# Patient Record
Sex: Female | Born: 1980 | Race: White | Hispanic: No | Marital: Married | Smoking: Never smoker
Health system: Southern US, Community
[De-identification: ages and names within clinical notes are randomized; demographics above are authoritative.]

## PROBLEM LIST (undated history)

## (undated) DIAGNOSIS — F419 Anxiety disorder, unspecified: Secondary | ICD-10-CM

## (undated) HISTORY — PX: DILATION AND CURETTAGE, DIAGNOSTIC / THERAPEUTIC: SUR384

---

## 2005-01-04 ENCOUNTER — Other Ambulatory Visit: Admission: RE | Admit: 2005-01-04 | Discharge: 2005-01-04 | Payer: Self-pay | Admitting: Obstetrics and Gynecology

## 2005-09-20 ENCOUNTER — Other Ambulatory Visit: Admission: RE | Admit: 2005-09-20 | Discharge: 2005-09-20 | Payer: Self-pay | Admitting: Obstetrics and Gynecology

## 2005-09-21 ENCOUNTER — Other Ambulatory Visit: Admission: RE | Admit: 2005-09-21 | Discharge: 2005-09-21 | Payer: Self-pay | Admitting: Obstetrics and Gynecology

## 2005-11-12 ENCOUNTER — Encounter (INDEPENDENT_AMBULATORY_CARE_PROVIDER_SITE_OTHER): Payer: Self-pay | Admitting: *Deleted

## 2005-11-12 ENCOUNTER — Ambulatory Visit (HOSPITAL_COMMUNITY): Admission: AD | Admit: 2005-11-12 | Discharge: 2005-11-12 | Payer: Self-pay | Admitting: Obstetrics & Gynecology

## 2006-08-27 ENCOUNTER — Inpatient Hospital Stay (HOSPITAL_COMMUNITY): Admission: AD | Admit: 2006-08-27 | Discharge: 2006-08-27 | Payer: Self-pay | Admitting: Obstetrics and Gynecology

## 2006-09-23 ENCOUNTER — Inpatient Hospital Stay (HOSPITAL_COMMUNITY): Admission: AD | Admit: 2006-09-23 | Discharge: 2006-09-25 | Payer: Self-pay | Admitting: Obstetrics and Gynecology

## 2009-10-20 ENCOUNTER — Inpatient Hospital Stay (HOSPITAL_COMMUNITY): Admission: AD | Admit: 2009-10-20 | Discharge: 2009-10-22 | Payer: Self-pay | Admitting: Obstetrics and Gynecology

## 2010-12-27 ENCOUNTER — Other Ambulatory Visit: Payer: Self-pay | Admitting: Obstetrics and Gynecology

## 2011-02-13 LAB — CBC
HCT: 39.7 % (ref 36.0–46.0)
Hemoglobin: 11.3 g/dL — ABNORMAL LOW (ref 12.0–15.0)
Hemoglobin: 13.1 g/dL (ref 12.0–15.0)
MCHC: 33.8 g/dL (ref 30.0–36.0)
MCV: 91 fL (ref 78.0–100.0)
RBC: 3.68 MIL/uL — ABNORMAL LOW (ref 3.87–5.11)
RBC: 4.35 MIL/uL (ref 3.87–5.11)
WBC: 12.1 10*3/uL — ABNORMAL HIGH (ref 4.0–10.5)
WBC: 14.7 10*3/uL — ABNORMAL HIGH (ref 4.0–10.5)

## 2011-03-30 NOTE — Op Note (Signed)
NAME:  Destiny Willis, Destiny Willis                ACCOUNT NO.:  [id number]   MEDICAL RECORD NO.:  [id number]          PATIENT TYPE:  MAT   LOCATION:  [facility]                          FACILITY:  [facility]   PHYSICIAN:  Gerrit Friends. Aldona Bar, M.D.   DATE OF BIRTH:  Oct 19, 1981   DATE OF PROCEDURE:  11/12/2005  DATE OF DISCHARGE:                                 OPERATIVE REPORT   PREOPERATIVE DIAGNOSIS:  First trimester incomplete abortion, blood type A+.   POSTOPERATIVE DIAGNOSIS:  First trimester incomplete abortion, blood type  A+. Pathology pending.   PROCEDURE:  Suction dilatation and curettage.   SURGEON:  Gerrit Friends. Aldona Bar, M.D.   ANESTHESIA:  Intravenous conscious sedation and paracervical block with 1%  Xylocaine with epinephrine.   HISTORY:  This 30 year old primigravid patient was seen by me in the office  on December 29 - at that time she was having some vaginal bleeding.  Previously she had been in the office as a new OB patient and had an  ultrasound when she was approximately [redacted] weeks gestation which confirmed a 6-  week intrauterine pregnancy with a fetal pole and fetal heartbeat. Fetal  heart at that time was reported as 100.  She called the office on the  afternoon of 12/29 with the onset of vaginal spotting and was asked to come  the office at which time I saw her and examined her. There was some old  blood in the vaginal vault and an ultrasound that I did revealed a 9-week  gestational sac with a 6-week fetal pole with no heartbeat a fetal movement.  Diagnosis of a missed AB was made and the patient was counseled. I suggested  D&C for evacuation of the pregnancy. The patient preferred to wait and have  a follow-up ultrasound and a visit in the office 5 days hence. She was  instructed as to bleeding and cramping , etc.  At midnight or shortly  thereafter on November 12, 2005, her husband called - the patient was having  significant vaginal bleeding and she was told to present to Maternity  Admissions which she did shortly thereafter. Her bleeding was moderate and a  decision was made to proceed with a suction dilatation and curettage for  evacuation of now an incomplete abortion.   DESCRIPTION OF PROCEDURE:  The patient was taken to the operating room after  the satisfactory induction of intravenous conscious sedation. She was  prepped and draped and placed in the modified lithotomy position in short  Allen stirrups. The bladder was draining clear urine through her catheter in  an in and out fashion and a speculum was placed in the vagina. The vaginal  vault was filled clot and once this was removed, the cervix was noted to be  at least a 1/2 cm dilated. A single tooth tenaculum was placed on the  anterior lip of cervix and a paracervical block was carried out with  approximately 11 mL of 1% Xylocaine with epinephrine. There was no need to  dilate the internal os as it was already dilated beyond the 25 Pratt  dilator. A #  8 suction curette was placed into the cavity and gently  thereafter the cavity was evacuated of products of conception. Curettage  with a medium standard curette then found the cavity to be clean.  Resuctioning produced no additional products of conception. The procedure at  this time was felt to be complete and was terminated. Examination at this  time found the uterus to be firm, midplane to posterior and bleeding was  minimal.   The patient was transported to the recovery room and thereafter she will be  discharged to home.   DISCHARGE MEDICATIONS:  Doxycycline 100 mg twice daily for a total of 5 days  and Anaprox DS 1 every 6-8 hours as needed for cramping.   FOLLOW UP:  She will be asked to return to the office in approximately 2-3  weeks time for follow-up or as needed.   CONDITION ON ARRIVAL TO RECOVERY ROOM:  Satisfactory.      Gerrit Friends. Aldona Bar, M.D.  Electronically Signed     RMW/MEDQ  D:  11/12/2005  T:  11/12/2005  Job:  [id number]

## 2014-01-18 ENCOUNTER — Other Ambulatory Visit: Payer: Self-pay | Admitting: Obstetrics and Gynecology

## 2016-07-25 ENCOUNTER — Other Ambulatory Visit: Payer: Self-pay | Admitting: Obstetrics & Gynecology

## 2016-07-26 LAB — CYTOLOGY - PAP

## 2016-09-13 DIAGNOSIS — F419 Anxiety disorder, unspecified: Secondary | ICD-10-CM | POA: Insufficient documentation

## 2017-05-09 ENCOUNTER — Emergency Department: Payer: BLUE CROSS/BLUE SHIELD

## 2017-05-09 ENCOUNTER — Other Ambulatory Visit: Payer: Self-pay

## 2017-05-09 ENCOUNTER — Encounter: Payer: Self-pay | Admitting: Emergency Medicine

## 2017-05-09 ENCOUNTER — Emergency Department
Admission: EM | Admit: 2017-05-09 | Discharge: 2017-05-09 | Disposition: A | Discharge status: Home / Self Care | Payer: BLUE CROSS/BLUE SHIELD | Attending: Emergency Medicine | Admitting: Emergency Medicine

## 2017-05-09 DIAGNOSIS — K8070 Calculus of gallbladder and bile duct without cholecystitis without obstruction: Secondary | ICD-10-CM

## 2017-05-09 DIAGNOSIS — R1011 Right upper quadrant pain: Secondary | ICD-10-CM | POA: Insufficient documentation

## 2017-05-09 DIAGNOSIS — R7989 Other specified abnormal findings of blood chemistry: Secondary | ICD-10-CM

## 2017-05-09 DIAGNOSIS — R945 Abnormal results of liver function studies: Secondary | ICD-10-CM

## 2017-05-09 DIAGNOSIS — R109 Unspecified abdominal pain: Secondary | ICD-10-CM | POA: Diagnosis present

## 2017-05-09 HISTORY — DX: Anxiety disorder, unspecified: F41.9

## 2017-05-09 LAB — LIPASE, BLOOD: LIPASE: 34 U/L (ref 11–51)

## 2017-05-09 LAB — COMPREHENSIVE METABOLIC PANEL
ALT: 637 U/L — AB (ref 14–54)
AST: 232 U/L — AB (ref 15–41)
Albumin: 4 g/dL (ref 3.5–5.0)
Alkaline Phosphatase: 140 U/L — ABNORMAL HIGH (ref 38–126)
Anion gap: 7 (ref 5–15)
BUN: 9 mg/dL (ref 6–20)
CHLORIDE: 102 mmol/L (ref 101–111)
CO2: 28 mmol/L (ref 22–32)
Calcium: 9.1 mg/dL (ref 8.9–10.3)
Creatinine, Ser: 0.7 mg/dL (ref 0.44–1.00)
Glucose, Bld: 84 mg/dL (ref 65–99)
POTASSIUM: 4.1 mmol/L (ref 3.5–5.1)
SODIUM: 137 mmol/L (ref 135–145)
Total Bilirubin: 1.3 mg/dL — ABNORMAL HIGH (ref 0.3–1.2)
Total Protein: 7.3 g/dL (ref 6.5–8.1)

## 2017-05-09 LAB — CBC
HEMATOCRIT: 42.3 % (ref 35.0–47.0)
Hemoglobin: 14.4 g/dL (ref 12.0–16.0)
MCH: 29.7 pg (ref 26.0–34.0)
MCHC: 34.1 g/dL (ref 32.0–36.0)
MCV: 87.1 fL (ref 80.0–100.0)
Platelets: 239 10*3/uL (ref 150–440)
RBC: 4.86 MIL/uL (ref 3.80–5.20)
RDW: 13.4 % (ref 11.5–14.5)
WBC: 7.8 10*3/uL (ref 3.6–11.0)

## 2017-05-09 LAB — ACETAMINOPHEN LEVEL: Acetaminophen (Tylenol), Serum: <10 ug/mL — ABNORMAL LOW (ref 10–30)

## 2017-05-09 MED ORDER — HYDROCODONE-ACETAMINOPHEN 5-325 MG PO TABS: 1.0000 | ORAL_TABLET | Freq: Four times a day (QID) | ORAL | 0 refills | Status: AC | PRN

## 2017-05-09 MED ORDER — ONDANSETRON 4 MG PO TBDP: ORAL_TABLET | ORAL | 0 refills | Status: AC

## 2017-05-09 MED ORDER — ONDANSETRON HCL 4 MG/2ML IJ SOLN
4.0000 mg | Freq: Once | INTRAMUSCULAR | Status: AC
  Administered 2017-05-09: 4 mg via INTRAVENOUS
  Filled 2017-05-09: qty 2

## 2017-05-09 MED ORDER — SODIUM CHLORIDE 0.9 % IV BOLUS (SEPSIS)
1000.0000 mL | Freq: Once | INTRAVENOUS | Status: AC
  Administered 2017-05-09: 1000 mL via INTRAVENOUS

## 2017-05-09 NOTE — ED Notes (Signed)
Pt given ginger ale to PO challenge 

## 2017-05-09 NOTE — Discharge Instructions (Signed)
Stay hydrated.   Take zofran as needed for nausea.   Expect some persistent epigastric and right sided abdominal pain.   Avoid fatty food or fried foods.   Take motrin for mild pain. Take vicodin for severe pain. DO NOT drive with it.   Surgery wants to see you next Friday at 9 am. Don't eat anything that morning and come at 8:30 am to get labs   Return to ER if you have fever, severe abdominal pain, vomiting, dehydration.

## 2017-05-09 NOTE — ED Notes (Signed)
Patient transported to Ultrasound 

## 2017-05-09 NOTE — ED Provider Notes (Signed)
[facility]-EMERGENCY DEPARTMENT Provider Note   CSN: [id number] Arrival date & time: 05/09/17  1033     History   Chief Complaint Chief Complaint  Patient presents with  . Abdominal Pain    HPI Destiny Sw[place] is a 36 y.o. female history of anxiety here presenting with abdominal pain. Patient has epigastric pain that radiates to bilateral flanks for the last 3 days. Patient states that it is worse when she eats and she feels nauseated but has not been throwing up. Denies any diarrhea or fevers. Denies any urinary symptoms. Patient went to primary care doctor yesterday and was noted to have elevated LFTs as well as a lipase over 1000. Patient denies any recent travel anywhere or eating uncooked meat. Patient is otherwise healthy.  The history is provided by the patient.    Past Medical History:  Diagnosis Date  . Anxiety     There are no active problems to display for this patient.   Past Surgical History:  Procedure Laterality Date  . DILATION AND CURETTAGE, DIAGNOSTIC / THERAPEUTIC      OB History    No data available       Home Medications    Prior to Admission medications   Not on File    Family History History reviewed. No pertinent family history.  Social History Social History  Substance Use Topics  . Smoking status: Never Smoker  . Smokeless tobacco: Never Used  . Alcohol use No     Allergies   Patient has no known allergies.   Review of Systems Review of Systems  Gastrointestinal: Positive for abdominal pain and nausea.  All other systems reviewed and are negative.    Physical Exam Updated Vital Signs BP (!) 101/59   Pulse 68   Temp 98 F (36.7 C) (Oral)   Resp 16   Ht 5\' 7"  (1.702 m)   Wt 72.1 kg (159 lb)   SpO2 98%   BMI 24.90 kg/m   Physical Exam  Constitutional: She is oriented to person, place, and time.  Slightly uncomfortable, MM slightly dry   HENT:  Head: Normocephalic.  Mouth/Throat: Oropharynx is clear and moist.    Eyes: Conjunctivae and EOM are normal. Pupils are equal, round, and reactive to light.  Neck: Normal range of motion. Neck supple.  Cardiovascular: Normal rate, regular rhythm and normal heart sounds.   Pulmonary/Chest: Effort normal and breath sounds normal. No respiratory distress. She has no wheezes. She has no rales.  Abdominal: Soft. Bowel sounds are normal.  Mild epigastric and RUQ tenderness, neg murphy. No obvious R CVAT   Musculoskeletal: Normal range of motion.  Neurological: She is alert and oriented to person, place, and time. No cranial nerve deficit. Coordination normal.  Skin: Skin is warm.  Psychiatric: She has a normal mood and affect.  Nursing note and vitals reviewed.    ED Treatments / Results  Labs (all labs ordered are listed, but only abnormal results are displayed) Labs Reviewed  COMPREHENSIVE METABOLIC PANEL - Abnormal; Notable for the following:       Result Value   AST 232 (*)    ALT 637 (*)    Alkaline Phosphatase 140 (*)    Total Bilirubin 1.3 (*)    All other components within normal limits  ACETAMINOPHEN LEVEL - Abnormal; Notable for the following:    Acetaminophen (Tylenol), Serum <10 (*)    All other components within normal limits  LIPASE, BLOOD  CBC  URINALYSIS, COMPLETE (UACMP) WITH MICROSCOPIC  HEPATITIS PANEL, ACUTE  POC URINE PREG, ED    EKG  EKG Interpretation None       Radiology US Abdomen Limited Ruq  Result Date: 05/09/2017 CLINICAL DATA:  Intermittent right upper quadrant pain for the past 4 days. EXAM: ULTRASOUND ABDOMEN LIMITED RIGHT UPPER QUADRANT COMPARISON:  None in PACs FINDINGS: Gallbladder: The gallbladder is adequately distended. Echogenic stones and sludge are present. There is no gallbladder wall thickening, pericholecystic fluid, or positive sonographic Murphy's sign. Common bile duct: Diameter: 4.7-6.6 mm.  No abnormal intraluminal echoes are observed. Liver: The hepatic echotexture is mildly increased. There is  no focal mass nor ductal dilation. The surface contour is smooth where visualized. IMPRESSION: Gallstones and sludge without sonographic evidence of acute cholecystitis. Increased hepatic echotexture most compatible with fatty infiltrative change. Electronically Signed   By: David  Willis M.D.   On: 05/09/2017 13:41    Procedures Procedures (including critical care time)  Medications Ordered in ED Medications  sodium chloride 0.9 % bolus 1,000 mL (1,000 mLs Intravenous New Bag/Given 05/09/17 1233)  ondansetron (ZOFRAN) injection 4 mg (4 mg Intravenous Given 05/09/17 1233)     Initial Impression / Assessment and Plan / ED Course  I have reviewed the triage vital signs and the nursing notes.  Pertinent labs & imaging results that were available during my care of the patient were reviewed by me and considered in my medical decision making (see chart for details).    Destiny Willis is a 36 y.o. female here with abdominal pain, elevated LFTs and lipase. Lipase was 1300 yesterday. Will repeat labs and LFTs and lipase. Concerned for possible gallstone pancreatitis. Will add hepatitis panel as well. Will get RUQ [place] to further assess.   1:59 PM  LFTs improving- AST 232 and ALT 637 down from 748 and 964 yesterday. Lipase was 1948 down to 35 today. No pain in the ED, refused pain meds. Tolerated PO in the ED. US showed gallstones with sludge with no acute chole. I called Dr. Aleen Campi from surgery, who reviewed records. Agrees with close outpatient follow up. He was able to make her an appointment next Fri (in about a week), for repeat LFTs and appointment in clinic. Gave strict return precautions.     Final Clinical Impressions(s) / ED Diagnoses   Final diagnoses:  RUQ pain    New Prescriptions New Prescriptions   No medications on file     Charlynne Pander, MD 05/09/17 1404

## 2017-05-09 NOTE — ED Triage Notes (Signed)
Pt c/o upper abdominal pain for 2 days. Was seen at doctor yesterday. Sent today for lipase over 1000.  Pt denies pain at this time.  No vomiting or diarrhea. Pain was worse after eating initially.  ambulatory to triage. NAD. VSS. No fevers.

## 2017-05-10 LAB — HEPATITIS PANEL, ACUTE
HCV Ab: <0.1 s/co ratio (ref 0.0–0.9)
HEP B C IGM: NEGATIVE
Hep A IgM: NEGATIVE
Hepatitis B Surface Ag: NEGATIVE

## 2017-05-13 ENCOUNTER — Other Ambulatory Visit: Payer: Self-pay

## 2017-05-17 ENCOUNTER — Inpatient Hospital Stay: Payer: Self-pay | Admitting: Surgery

## 2019-03-16 IMAGING — US US ABDOMEN LIMITED
1 series · 14 of 25 positions shown · non-contrast
Comparison: None in PACs

CLINICAL DATA: Intermittent right upper quadrant pain for the past
4 days.

EXAM:
ULTRASOUND ABDOMEN LIMITED RIGHT UPPER QUADRANT

[Series 1: us abdomen limited · 0.20mm/px · 14 of 67 slices shown]
[im 1/67]
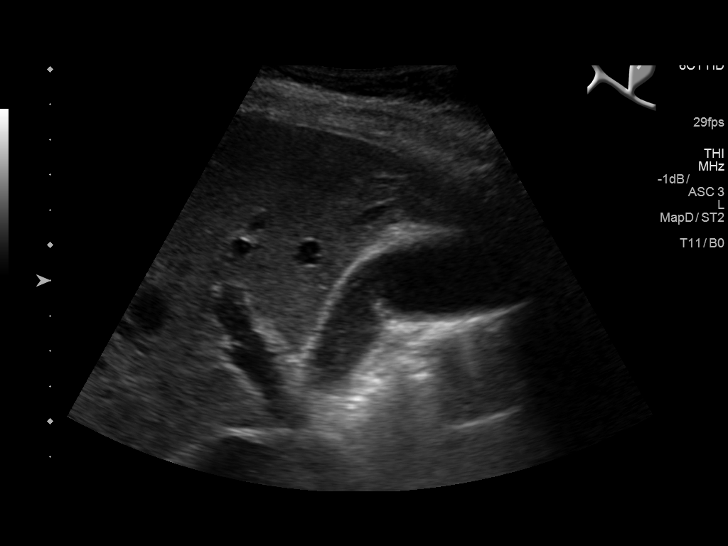
[im 6/67]
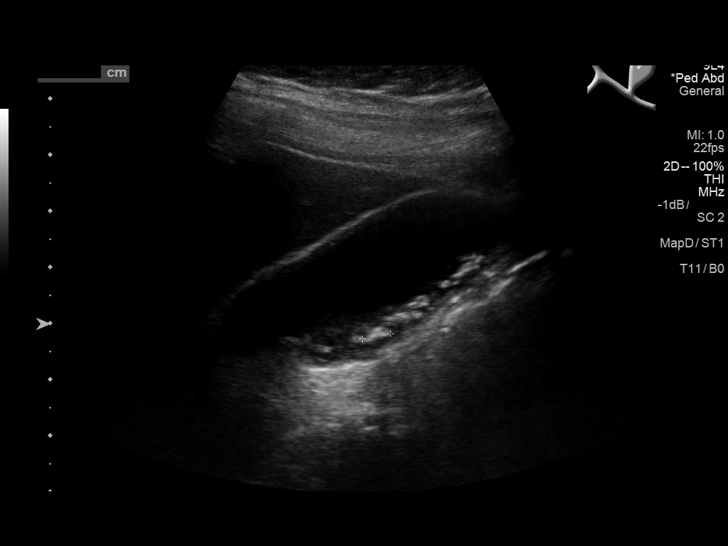
[im 12/67]
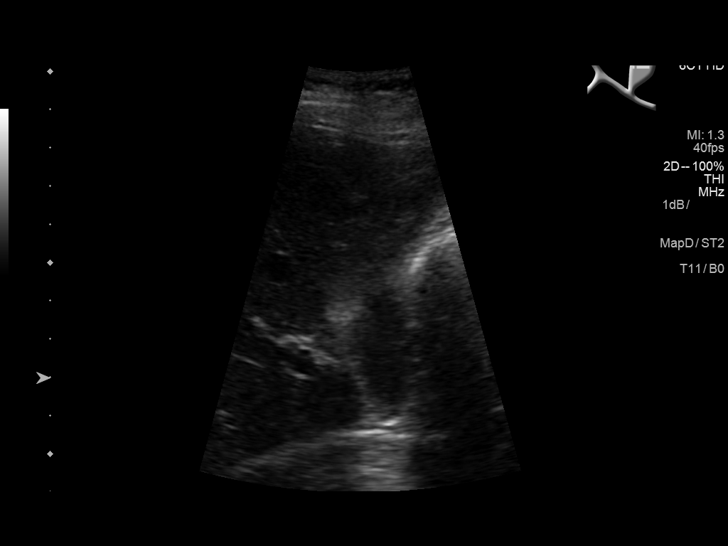
[im 17/67]
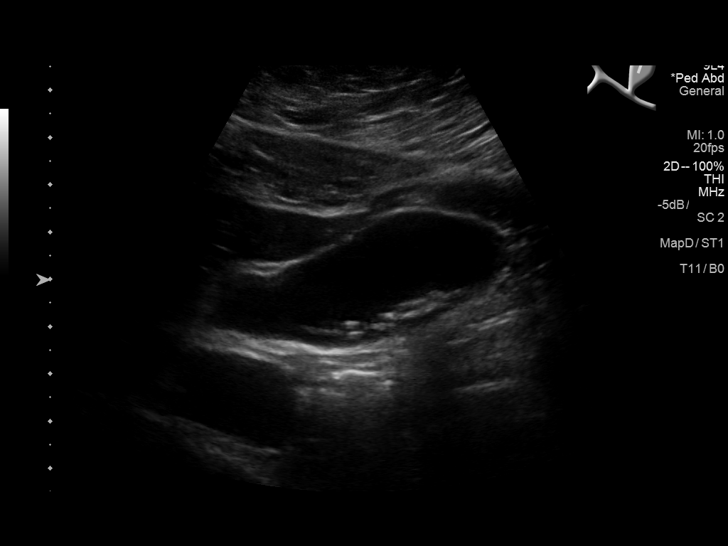
[im 23/67]
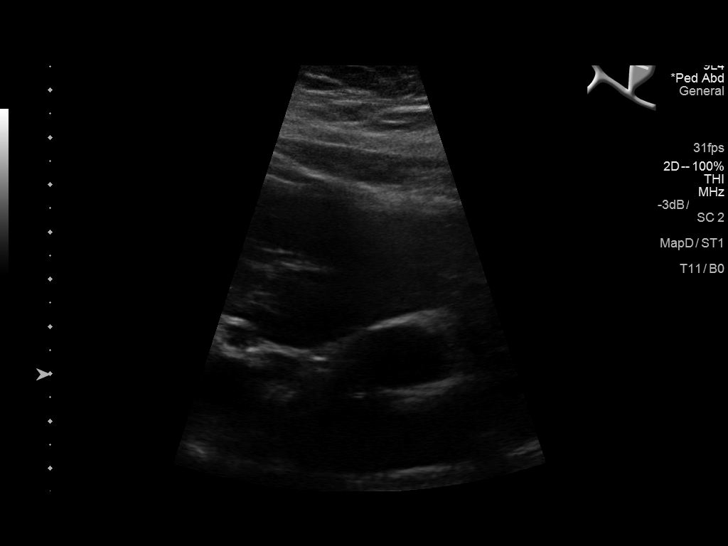
[im 25/67]
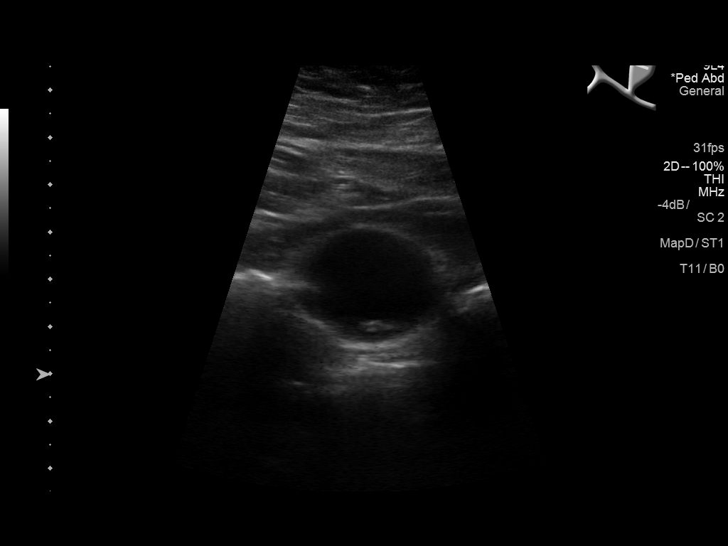
[im 31/67]
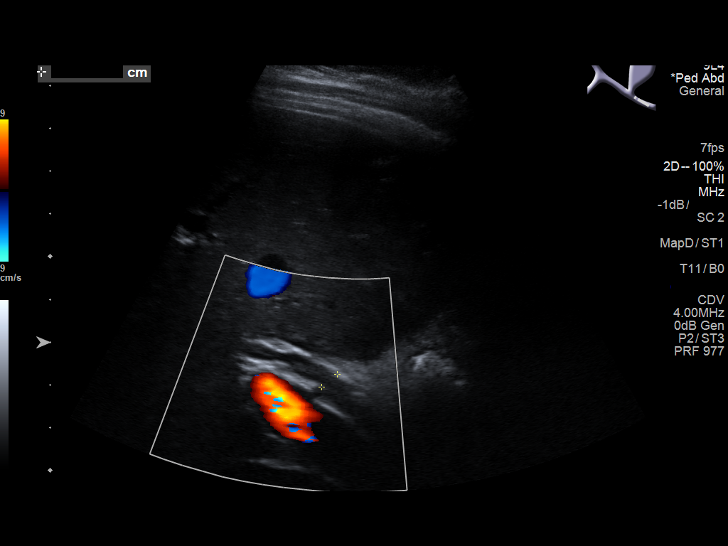
[im 36/67]
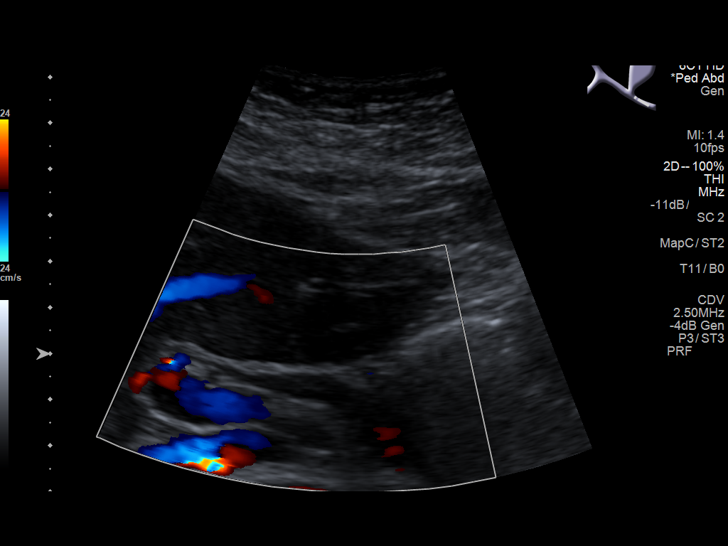
[im 42/67]
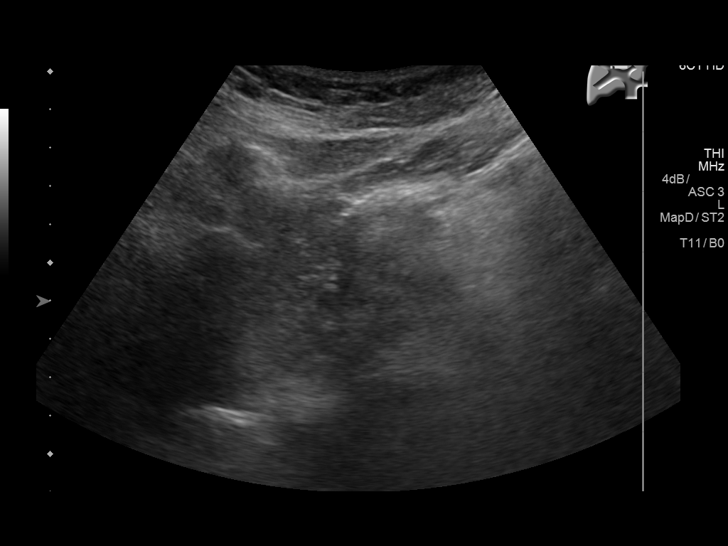
[im 45/67]
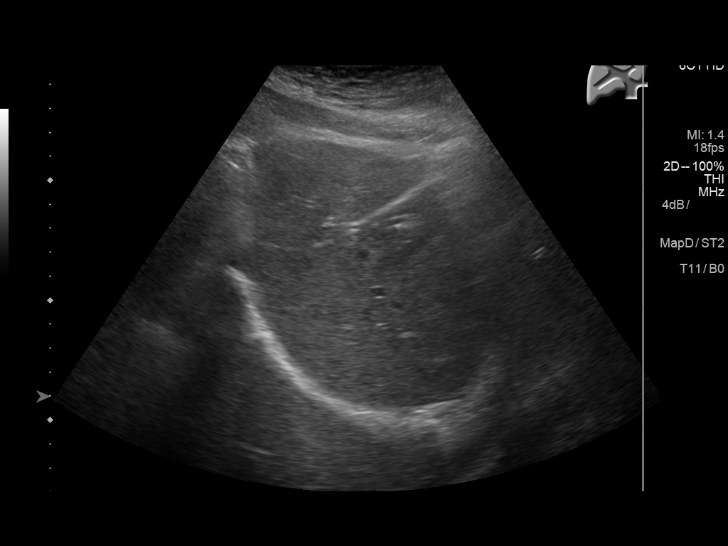
[im 50/67]
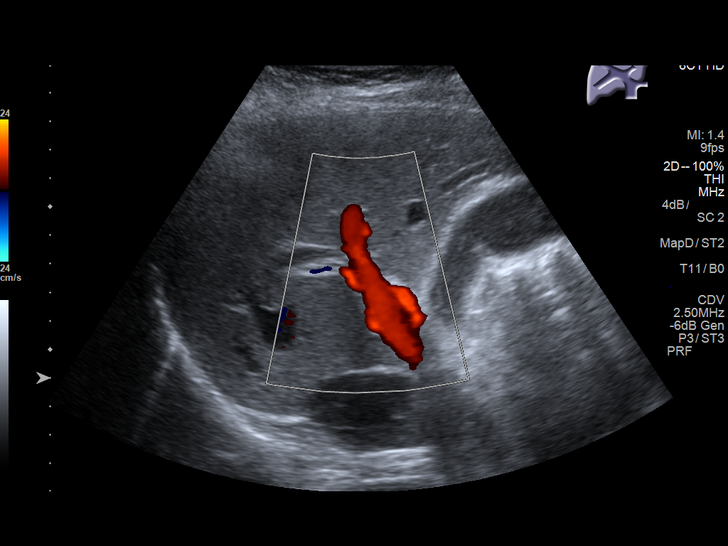
[im 56/67]
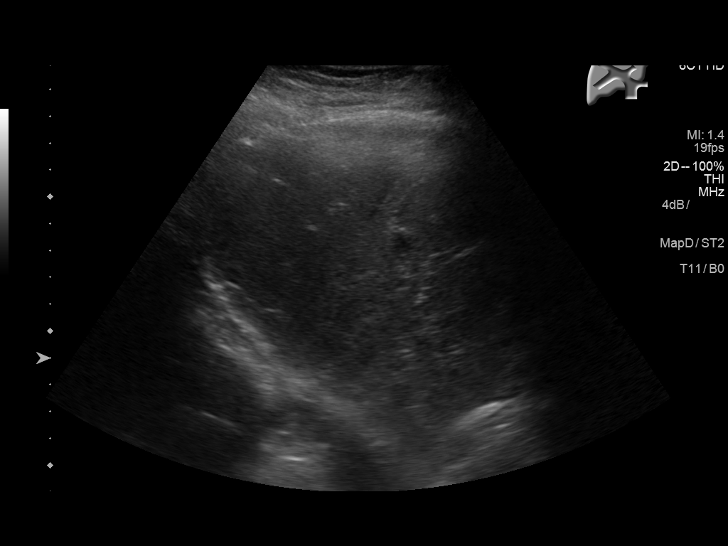
[im 61/67]
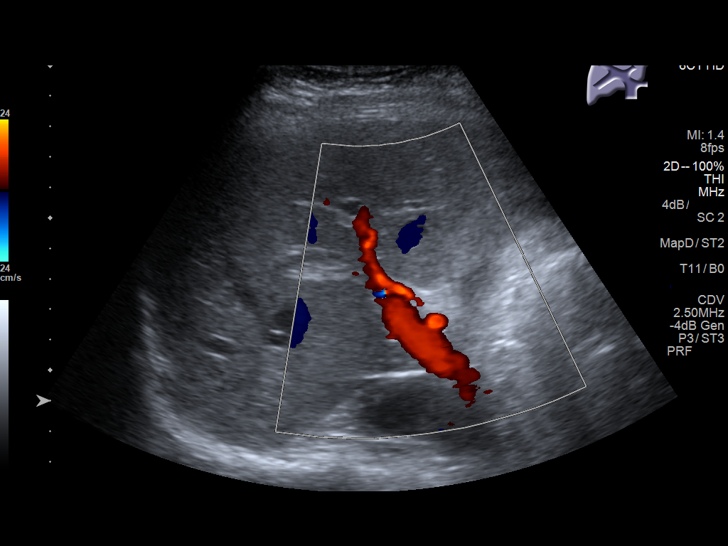
[im 67/67]
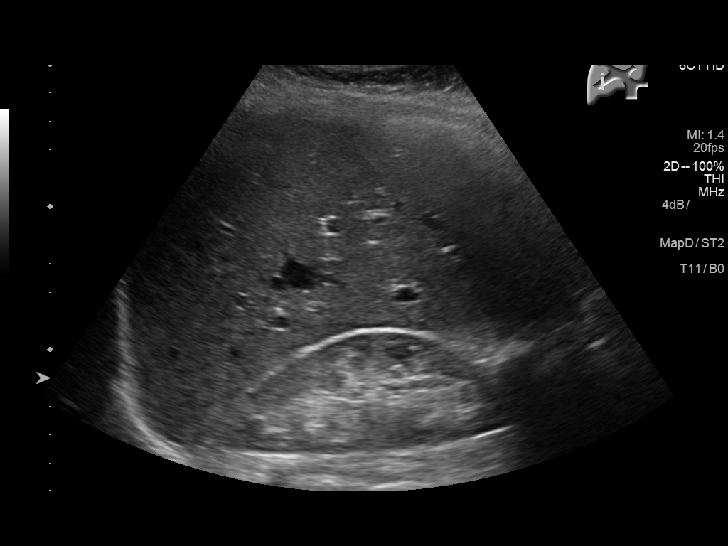

[14 of 25 positions shown; findings below may reference images not displayed]

FINDINGS: Gallbladder:

The gallbladder is adequately distended. Echogenic stones and sludge
are present. There is no gallbladder wall thickening,
pericholecystic fluid, or positive sonographic Murphy's sign.

Common bile duct:

Diameter: 4.7-6.6 mm.  No abnormal intraluminal echoes are observed.

Liver:

The hepatic echotexture is mildly increased. There is no focal mass
nor ductal dilation. The surface contour is smooth where visualized.
IMPRESSION: Gallstones and sludge without sonographic evidence of acute
cholecystitis.

Increased hepatic echotexture most compatible with fatty
infiltrative change.
